# Patient Record
Sex: Female | Born: 1960 | Race: White | Hispanic: No | Marital: Married | Smoking: Never smoker
Health system: Southern US, Community
[De-identification: ages and names within clinical notes are randomized; demographics above are authoritative.]

## PROBLEM LIST (undated history)

## (undated) DIAGNOSIS — N390 Urinary tract infection, site not specified: Secondary | ICD-10-CM

## (undated) DIAGNOSIS — B019 Varicella without complication: Secondary | ICD-10-CM

## (undated) HISTORY — DX: Urinary tract infection, site not specified: N39.0

## (undated) HISTORY — DX: Varicella without complication: B01.9

---
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
## 2003-05-04 ENCOUNTER — Other Ambulatory Visit: Admission: RE | Admit: 2003-05-04 | Discharge: 2003-05-04 | Payer: Self-pay | Admitting: Obstetrics and Gynecology

## 2003-12-22 ENCOUNTER — Ambulatory Visit: Payer: Self-pay | Admitting: Family Medicine

## 2003-12-29 ENCOUNTER — Ambulatory Visit: Payer: Self-pay | Admitting: Internal Medicine

## 2005-05-21 ENCOUNTER — Ambulatory Visit: Payer: Self-pay | Admitting: Family Medicine

## 2005-06-11 ENCOUNTER — Ambulatory Visit: Payer: Self-pay | Admitting: Family Medicine

## 2005-08-04 ENCOUNTER — Ambulatory Visit: Payer: Self-pay | Admitting: Family Medicine

## 2006-07-23 ENCOUNTER — Encounter: Payer: Self-pay | Admitting: Family Medicine

## 2006-07-23 ENCOUNTER — Ambulatory Visit: Payer: Self-pay | Admitting: Family Medicine

## 2006-07-23 LAB — CONVERTED CEMR LAB
ALT: 19 units/L (ref 0–35)
AST: 21 units/L (ref 0–37)
Albumin: 3.4 g/dL — ABNORMAL LOW (ref 3.5–5.2)
Alkaline Phosphatase: 59 units/L (ref 39–117)
Bilirubin, Direct: 0.1 mg/dL (ref 0.0–0.3)
Calcium: 9.6 mg/dL (ref 8.4–10.5)
Chloride: 105 meq/L (ref 96–112)
Eosinophils Absolute: 0.1 10*3/uL (ref 0.0–0.6)
Eosinophils Relative: 0.9 % (ref 0.0–5.0)
GFR calc non Af Amer: 97 mL/min
Glucose, Bld: 87 mg/dL (ref 70–99)
HDL: 109.8 mg/dL (ref >39.0)
MCV: 91.9 fL (ref 78.0–100.0)
Platelets: 290 10*3/uL (ref 150–400)
RBC: 4.15 M/uL (ref 3.87–5.11)
Triglycerides: 80 mg/dL (ref 0–149)
VLDL: 16 mg/dL (ref 0–40)
WBC: 6.3 10*3/uL (ref 4.5–10.5)

## 2006-08-21 ENCOUNTER — Ambulatory Visit: Payer: Self-pay | Admitting: Family Medicine

## 2006-08-21 DIAGNOSIS — G4700 Insomnia, unspecified: Secondary | ICD-10-CM

## 2006-08-21 DIAGNOSIS — L723 Sebaceous cyst: Secondary | ICD-10-CM

## 2006-08-21 DIAGNOSIS — B359 Dermatophytosis, unspecified: Secondary | ICD-10-CM | POA: Insufficient documentation

## 2013-03-03 ENCOUNTER — Ambulatory Visit: Payer: Self-pay | Admitting: Family Medicine

## 2013-03-18 ENCOUNTER — Ambulatory Visit: Payer: Self-pay | Admitting: Family Medicine

## 2013-04-06 ENCOUNTER — Encounter: Payer: Self-pay | Admitting: Family Medicine

## 2013-04-06 ENCOUNTER — Ambulatory Visit (INDEPENDENT_AMBULATORY_CARE_PROVIDER_SITE_OTHER): Payer: BC Managed Care – PPO | Admitting: Family Medicine

## 2013-04-06 VITALS — BP 120/70 | Temp 98.8°F | Ht 69.0 in | Wt 164.0 lb

## 2013-04-06 DIAGNOSIS — M791 Myalgia, unspecified site: Secondary | ICD-10-CM

## 2013-04-06 DIAGNOSIS — Z7689 Persons encountering health services in other specified circumstances: Secondary | ICD-10-CM

## 2013-04-06 DIAGNOSIS — Z7189 Other specified counseling: Secondary | ICD-10-CM

## 2013-04-06 DIAGNOSIS — M79603 Pain in arm, unspecified: Secondary | ICD-10-CM

## 2013-04-06 DIAGNOSIS — IMO0001 Reserved for inherently not codable concepts without codable children: Secondary | ICD-10-CM

## 2013-04-06 DIAGNOSIS — M79609 Pain in unspecified limb: Secondary | ICD-10-CM

## 2013-04-06 NOTE — Progress Notes (Signed)
Chief Complaint  Patient presents with  . Establish Care    HPI:  Taylor Russell is here to establish care.  Last PCP and physical: sees Dr. Josph Macho for yearly physicals. Recently had extensive lab work with thyroid, blood counts and everything for hair loss and all normal.  Has the following chronic problems and concerns today:  Patient Active Problem List   Diagnosis Date Noted  . DERMATOPHYTOSIS, SITE NOS 08/21/2006  . INSOMNIA, PERSISTENT 08/21/2006  . 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  Past Medical History  Diagnosis Date  . UTI (urinary tract infection)   . Chicken pox     Family History  Problem Relation Age of Onset  . Ovarian cancer Maternal Grandmother   . Ovarian cancer Paternal Grandmother   . Prostate cancer Father   . Bone cancer Father   . Lymphoma Father   . Hypertension Mother   . Kidney disease Mother     History   Social History  . Marital Status: Legally Separated    Spouse Name: N/A    Number of Children: N/A  . Years of Education: N/A   Social History Main Topics  . Smoking status: Never Smoker   . Smokeless tobacco: None  . Alcohol Use: Yes     Comment: occ  . Drug Use: None  . Sexual Activity: None   Other Topics Concern  . 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  EXAM:  Filed Vitals:   04/06/13 1111  BP: 120/70  Temp: 98.8 F (37.1 C)    Body mass index is 24.21 kg/(m^2).  GENERAL: vitals reviewed and listed above, alert, oriented, appears well hydrated and in no acute distress  HEENT: atraumatic, conjunttiva clear, no obvious abnormalities on inspection of external nose and ears  NECK: no obvious masses on inspection  LUNGS: clear to auscultation bilaterally, no wheezes, rales or rhonchi, good air movement  CV: HRRR, no peripheral edema  Breasts: normal, axilla normal  MS: moves all extremities without noticeable abnormality -normal sensation and strength in arms -neg tinels, mildly post phalens with pain -TTP bilat pecs, neg chest wall exam otherwise except forward head and shoulder posture  PSYCH: pleasant and cooperative, no obvious depression or anxiety  ASSESSMENT AND PLAN:  Discussed the following assessment and plan:  Encounter to establish care  Arm pain  Muscle pain  -We reviewed the PMH, PSH, FH, SH, Meds and Allergies. -We provided refills for any medications we will prescribe as needed. -We addressed current concerns per orders and patient instructions. -We have  asked for records for pertinent exams, studies, vaccines and notes from previous providers. -We have advised patient to follow up per instructions below. -arms symptoms sound like CTS and advised cockup brace, proper posture  and HEP after discussion possible etioloigies -advised to get mammogram, no abnormalities of breasts -TTP in pectoralis muscles with forward head and shoulder posture and large breasts and advised exercises and stretching and good bra for this, offered CXR and labs - she declined at this time -follow up in 1-2 months  -Patient advised to  return or notify a doctor immediately if symptoms worsen or persist or new concerns arise.  Patient Instructions  -please schedule mammogram  -PLEASE SIGN UP FOR MYCHART TODAY   We recommend the following healthy lifestyle measures: - eat a healthy diet consisting of lots of vegetables, fruits, beans, nuts, seeds, healthy meats such as white chicken and fish and whole grains.  - avoid fried foods, fast food, processed foods, sodas, red meet and other fattening foods.  - get a least 150 minutes of aerobic exercise per week.   Follow up in:       Kriste BasqueKIM, HANNAH R.

## 2013-04-06 NOTE — Patient Instructions (Addendum)
-  please schedule mammogram  -PLEASE SIGN UP FOR MYCHART TODAY   -where cock up brace at night  -home exercises  We recommend the following healthy lifestyle measures: - eat a healthy diet consisting of lots of vegetables, fruits, beans, nuts, seeds, healthy meats such as white chicken and fish and whole grains.  - avoid fried foods, fast food, processed foods, sodas, red meet and other fattening foods.  - get a least 150 minutes of aerobic exercise per week.   Follow up in:  1-2 months

## 2013-05-09 ENCOUNTER — Encounter: Payer: Self-pay | Admitting: Family Medicine

## 2013-05-19 ENCOUNTER — Ambulatory Visit: Payer: BC Managed Care – PPO | Admitting: Family Medicine

## 2015-04-26 DIAGNOSIS — R1084 Generalized abdominal pain: Secondary | ICD-10-CM | POA: Diagnosis not present

## 2015-04-26 DIAGNOSIS — R140 Abdominal distension (gaseous): Secondary | ICD-10-CM | POA: Diagnosis not present

## 2015-04-26 DIAGNOSIS — R102 Pelvic and perineal pain: Secondary | ICD-10-CM | POA: Diagnosis not present

## 2015-04-26 DIAGNOSIS — K5900 Constipation, unspecified: Secondary | ICD-10-CM | POA: Diagnosis not present

## 2015-07-12 DIAGNOSIS — M79669 Pain in unspecified lower leg: Secondary | ICD-10-CM | POA: Diagnosis not present

## 2015-10-19 DIAGNOSIS — L639 Alopecia areata, unspecified: Secondary | ICD-10-CM | POA: Diagnosis not present

## 2015-10-19 DIAGNOSIS — D2262 Melanocytic nevi of left upper limb, including shoulder: Secondary | ICD-10-CM | POA: Diagnosis not present

## 2015-10-19 DIAGNOSIS — D2271 Melanocytic nevi of right lower limb, including hip: Secondary | ICD-10-CM | POA: Diagnosis not present

## 2015-10-19 DIAGNOSIS — D225 Melanocytic nevi of trunk: Secondary | ICD-10-CM | POA: Diagnosis not present

## 2015-11-02 DIAGNOSIS — M79669 Pain in unspecified lower leg: Secondary | ICD-10-CM | POA: Diagnosis not present

## 2016-06-16 DIAGNOSIS — Z01419 Encounter for gynecological examination (general) (routine) without abnormal findings: Secondary | ICD-10-CM | POA: Diagnosis not present

## 2016-06-16 DIAGNOSIS — Z131 Encounter for screening for diabetes mellitus: Secondary | ICD-10-CM | POA: Diagnosis not present

## 2016-06-16 DIAGNOSIS — Z1231 Encounter for screening mammogram for malignant neoplasm of breast: Secondary | ICD-10-CM | POA: Diagnosis not present

## 2016-06-16 DIAGNOSIS — Z1329 Encounter for screening for other suspected endocrine disorder: Secondary | ICD-10-CM | POA: Diagnosis not present

## 2016-06-16 DIAGNOSIS — Z0000 Encounter for general adult medical examination without abnormal findings: Secondary | ICD-10-CM | POA: Diagnosis not present

## 2016-06-16 DIAGNOSIS — Z130 Encounter for screening for diseases of the blood and blood-forming organs and certain disorders involving the immune mechanism: Secondary | ICD-10-CM | POA: Diagnosis not present

## 2016-06-16 DIAGNOSIS — Z6825 Body mass index (BMI) 25.0-25.9, adult: Secondary | ICD-10-CM | POA: Diagnosis not present

## 2016-06-16 DIAGNOSIS — Z1321 Encounter for screening for nutritional disorder: Secondary | ICD-10-CM | POA: Diagnosis not present

## 2016-06-16 DIAGNOSIS — Z13220 Encounter for screening for lipoid disorders: Secondary | ICD-10-CM | POA: Diagnosis not present

## 2016-10-23 DIAGNOSIS — D225 Melanocytic nevi of trunk: Secondary | ICD-10-CM | POA: Diagnosis not present

## 2016-10-23 DIAGNOSIS — D2271 Melanocytic nevi of right lower limb, including hip: Secondary | ICD-10-CM | POA: Diagnosis not present

## 2016-10-23 DIAGNOSIS — L818 Other specified disorders of pigmentation: Secondary | ICD-10-CM | POA: Diagnosis not present

## 2016-10-23 DIAGNOSIS — D1800 Hemangioma unspecified site: Secondary | ICD-10-CM | POA: Diagnosis not present

## 2016-12-25 DIAGNOSIS — B029 Zoster without complications: Secondary | ICD-10-CM | POA: Diagnosis not present

## 2016-12-26 DIAGNOSIS — D3132 Benign neoplasm of left choroid: Secondary | ICD-10-CM | POA: Diagnosis not present

## 2016-12-26 DIAGNOSIS — H04123 Dry eye syndrome of bilateral lacrimal glands: Secondary | ICD-10-CM | POA: Diagnosis not present

## 2017-01-28 DIAGNOSIS — D3132 Benign neoplasm of left choroid: Secondary | ICD-10-CM | POA: Diagnosis not present

## 2017-01-28 DIAGNOSIS — H04123 Dry eye syndrome of bilateral lacrimal glands: Secondary | ICD-10-CM | POA: Diagnosis not present

## 2017-07-29 DIAGNOSIS — B0230 Zoster ocular disease, unspecified: Secondary | ICD-10-CM | POA: Diagnosis not present

## 2017-07-29 DIAGNOSIS — H04123 Dry eye syndrome of bilateral lacrimal glands: Secondary | ICD-10-CM | POA: Diagnosis not present

## 2017-07-29 DIAGNOSIS — D3132 Benign neoplasm of left choroid: Secondary | ICD-10-CM | POA: Diagnosis not present

## 2017-08-07 DIAGNOSIS — N952 Postmenopausal atrophic vaginitis: Secondary | ICD-10-CM | POA: Diagnosis not present

## 2017-08-07 DIAGNOSIS — Z1329 Encounter for screening for other suspected endocrine disorder: Secondary | ICD-10-CM | POA: Diagnosis not present

## 2017-08-07 DIAGNOSIS — Z130 Encounter for screening for diseases of the blood and blood-forming organs and certain disorders involving the immune mechanism: Secondary | ICD-10-CM | POA: Diagnosis not present

## 2017-08-07 DIAGNOSIS — Z0000 Encounter for general adult medical examination without abnormal findings: Secondary | ICD-10-CM | POA: Diagnosis not present

## 2017-08-07 DIAGNOSIS — Z13220 Encounter for screening for lipoid disorders: Secondary | ICD-10-CM | POA: Diagnosis not present

## 2017-08-07 DIAGNOSIS — Z01419 Encounter for gynecological examination (general) (routine) without abnormal findings: Secondary | ICD-10-CM | POA: Diagnosis not present

## 2017-08-07 DIAGNOSIS — Z6826 Body mass index (BMI) 26.0-26.9, adult: Secondary | ICD-10-CM | POA: Diagnosis not present

## 2017-08-07 DIAGNOSIS — Z131 Encounter for screening for diabetes mellitus: Secondary | ICD-10-CM | POA: Diagnosis not present

## 2017-11-19 DIAGNOSIS — D225 Melanocytic nevi of trunk: Secondary | ICD-10-CM | POA: Diagnosis not present

## 2017-11-19 DIAGNOSIS — B351 Tinea unguium: Secondary | ICD-10-CM | POA: Diagnosis not present

## 2017-11-19 DIAGNOSIS — D2261 Melanocytic nevi of right upper limb, including shoulder: Secondary | ICD-10-CM | POA: Diagnosis not present

## 2017-11-19 DIAGNOSIS — D2271 Melanocytic nevi of right lower limb, including hip: Secondary | ICD-10-CM | POA: Diagnosis not present

## 2017-11-19 DIAGNOSIS — D2262 Melanocytic nevi of left upper limb, including shoulder: Secondary | ICD-10-CM | POA: Diagnosis not present

## 2017-11-24 DIAGNOSIS — R635 Abnormal weight gain: Secondary | ICD-10-CM | POA: Diagnosis not present

## 2017-11-24 DIAGNOSIS — Z1211 Encounter for screening for malignant neoplasm of colon: Secondary | ICD-10-CM | POA: Diagnosis not present

## 2017-11-24 DIAGNOSIS — R194 Change in bowel habit: Secondary | ICD-10-CM | POA: Diagnosis not present

## 2017-11-25 ENCOUNTER — Other Ambulatory Visit: Payer: Self-pay | Admitting: Internal Medicine

## 2017-11-25 DIAGNOSIS — E041 Nontoxic single thyroid nodule: Secondary | ICD-10-CM

## 2017-11-25 DIAGNOSIS — Z8349 Family history of other endocrine, nutritional and metabolic diseases: Secondary | ICD-10-CM | POA: Diagnosis not present

## 2017-11-27 ENCOUNTER — Ambulatory Visit
Admission: RE | Admit: 2017-11-27 | Discharge: 2017-11-27 | Disposition: A | Discharge status: Home / Self Care | Payer: BLUE CROSS/BLUE SHIELD | Source: Ambulatory Visit | Attending: Internal Medicine | Admitting: Internal Medicine

## 2017-11-27 DIAGNOSIS — E041 Nontoxic single thyroid nodule: Secondary | ICD-10-CM | POA: Diagnosis not present

## 2018-06-03 DIAGNOSIS — Z0000 Encounter for general adult medical examination without abnormal findings: Secondary | ICD-10-CM | POA: Diagnosis not present

## 2018-06-03 DIAGNOSIS — E559 Vitamin D deficiency, unspecified: Secondary | ICD-10-CM | POA: Diagnosis not present

## 2018-06-03 DIAGNOSIS — Z0001 Encounter for general adult medical examination with abnormal findings: Secondary | ICD-10-CM | POA: Diagnosis not present

## 2018-06-17 DIAGNOSIS — Z0000 Encounter for general adult medical examination without abnormal findings: Secondary | ICD-10-CM | POA: Diagnosis not present

## 2018-06-17 DIAGNOSIS — E559 Vitamin D deficiency, unspecified: Secondary | ICD-10-CM | POA: Diagnosis not present

## 2018-07-28 DIAGNOSIS — D3132 Benign neoplasm of left choroid: Secondary | ICD-10-CM | POA: Diagnosis not present

## 2018-07-28 DIAGNOSIS — H16223 Keratoconjunctivitis sicca, not specified as Sjogren's, bilateral: Secondary | ICD-10-CM | POA: Diagnosis not present

## 2018-09-07 DIAGNOSIS — Z20828 Contact with and (suspected) exposure to other viral communicable diseases: Secondary | ICD-10-CM | POA: Diagnosis not present

## 2018-09-07 DIAGNOSIS — Z6825 Body mass index (BMI) 25.0-25.9, adult: Secondary | ICD-10-CM | POA: Diagnosis not present

## 2018-09-08 DIAGNOSIS — Z01419 Encounter for gynecological examination (general) (routine) without abnormal findings: Secondary | ICD-10-CM | POA: Diagnosis not present

## 2018-09-08 DIAGNOSIS — Z6826 Body mass index (BMI) 26.0-26.9, adult: Secondary | ICD-10-CM | POA: Diagnosis not present

## 2018-09-08 DIAGNOSIS — Z1231 Encounter for screening mammogram for malignant neoplasm of breast: Secondary | ICD-10-CM | POA: Diagnosis not present

## 2018-09-08 DIAGNOSIS — Z1151 Encounter for screening for human papillomavirus (HPV): Secondary | ICD-10-CM | POA: Diagnosis not present

## 2018-10-28 DIAGNOSIS — D225 Melanocytic nevi of trunk: Secondary | ICD-10-CM | POA: Diagnosis not present

## 2018-10-28 DIAGNOSIS — D2271 Melanocytic nevi of right lower limb, including hip: Secondary | ICD-10-CM | POA: Diagnosis not present

## 2018-10-28 DIAGNOSIS — Z23 Encounter for immunization: Secondary | ICD-10-CM | POA: Diagnosis not present

## 2018-10-28 DIAGNOSIS — D2261 Melanocytic nevi of right upper limb, including shoulder: Secondary | ICD-10-CM | POA: Diagnosis not present

## 2018-10-28 DIAGNOSIS — D2262 Melanocytic nevi of left upper limb, including shoulder: Secondary | ICD-10-CM | POA: Diagnosis not present

## 2019-03-12 ENCOUNTER — Ambulatory Visit: Payer: BC Managed Care – PPO | Attending: Internal Medicine

## 2019-03-12 ENCOUNTER — Other Ambulatory Visit: Payer: Self-pay

## 2019-03-12 DIAGNOSIS — Z23 Encounter for immunization: Secondary | ICD-10-CM | POA: Insufficient documentation

## 2019-03-12 NOTE — Progress Notes (Signed)
   Covid-19 Vaccination Clinic  Name:  Taylor Russell    MRN: [id number] DOB: 1960/07/12  03/12/2019  [name] was observed post Covid-19 immunization for 15 minutes without incident. She was provided with Vaccine Information Sheet and instruction to access the V-Safe system.   [name] was instructed to call 911 with any severe reactions post vaccine: Marland Kitchen Difficulty breathing  . Swelling of face and throat  . A fast heartbeat  . A bad rash all over body  . Dizziness and weakness   Immunizations Administered    Name Date Dose VIS Date Route   Moderna COVID-19 Vaccine 03/12/2019  9:29 AM 0.5 mL 12/07/2018 Intramuscular   Manufacturer: Moderna   Lot: [id number]   NDC: 33007-622-63

## 2019-03-26 ENCOUNTER — Emergency Department (HOSPITAL_BASED_OUTPATIENT_CLINIC_OR_DEPARTMENT_OTHER)
Admission: EM | Admit: 2019-03-26 | Discharge: 2019-03-26 | Disposition: A | Discharge status: Home / Self Care | Payer: BC Managed Care – PPO | Attending: Emergency Medicine | Admitting: Emergency Medicine

## 2019-03-26 ENCOUNTER — Encounter (HOSPITAL_BASED_OUTPATIENT_CLINIC_OR_DEPARTMENT_OTHER): Payer: Self-pay | Admitting: Emergency Medicine

## 2019-03-26 DIAGNOSIS — Y999 Unspecified external cause status: Secondary | ICD-10-CM | POA: Diagnosis not present

## 2019-03-26 DIAGNOSIS — W268XXA Contact with other sharp object(s), not elsewhere classified, initial encounter: Secondary | ICD-10-CM | POA: Insufficient documentation

## 2019-03-26 DIAGNOSIS — S61217A Laceration without foreign body of left little finger without damage to nail, initial encounter: Secondary | ICD-10-CM | POA: Insufficient documentation

## 2019-03-26 DIAGNOSIS — Z23 Encounter for immunization: Secondary | ICD-10-CM | POA: Insufficient documentation

## 2019-03-26 DIAGNOSIS — Y92000 Kitchen of unspecified non-institutional (private) residence as  the place of occurrence of the external cause: Secondary | ICD-10-CM | POA: Insufficient documentation

## 2019-03-26 DIAGNOSIS — Y93E9 Activity, other interior property and clothing maintenance: Secondary | ICD-10-CM | POA: Insufficient documentation

## 2019-03-26 MED ORDER — TETANUS-DIPHTH-ACELL PERTUSSIS 5-2.5-18.5 LF-MCG/0.5 IM SUSP
0.5000 mL | Freq: Once | INTRAMUSCULAR | Status: DC
  Filled 2019-03-26: qty 0.5

## 2019-03-26 MED ORDER — LIDOCAINE HCL 2 % IJ SOLN
10.0000 mL | Freq: Once | INTRAMUSCULAR | Status: AC
  Administered 2019-03-26: 11:00:00 200 mg via INTRADERMAL
  Filled 2019-03-26: qty 20

## 2019-03-26 MED ORDER — TETANUS-DIPHTH-ACELL PERTUSSIS 5-2.5-18.5 LF-MCG/0.5 IM SUSP
0.5000 mL | Freq: Once | INTRAMUSCULAR | Status: AC
  Administered 2019-03-26: 0.5 mL via INTRAMUSCULAR

## 2019-03-26 NOTE — ED Notes (Signed)
ED Provider at bedside. 

## 2019-03-26 NOTE — Discharge Instructions (Signed)
Please follow-up for suture removal at either urgent care ,the emergency department, or your primary care doctor in 10-14 days.  Please return to the emergency room immediately if you experience any new or worsening symptoms or any symptoms that indicate worsening infection such as fevers, increased redness/swelling/pain, warmth, or drainage from the affected area.   

## 2019-03-26 NOTE — ED Triage Notes (Signed)
States cut her finger while cleaning the oven about 20 min ago. Lac to 5th finger left hand, pressure drsg applied

## 2019-03-26 NOTE — ED Provider Notes (Signed)
[facility] EMERGENCY DEPARTMENT Provider Note   CSN: [id number] Arrival date & time: 03/26/19  1010     History Chief Complaint  Patient presents with  . Laceration    Taylor Russell is a 59 y.o. female.  HPI   Pt is a 59 y/o female who presents to the ED today for eval of left 5th finger injury. States she cut it while cleaning her oven PTA.  She has normal range of motion and sensation. Denies other injuries.  Last Tdap was in 2015.   Past Medical History:  Diagnosis Date  . Chicken pox   . UTI (urinary tract infection)     Patient Active Problem List   Diagnosis Date Noted  . DERMATOPHYTOSIS, SITE NOS 08/21/2006  . INSOMNIA, PERSISTENT 08/21/2006  . EPIDERMOID CYST 08/21/2006    Past Surgical History:  Procedure Laterality Date  . APPENDECTOMY  1968  . TONSILLECTOMY AND ADENOIDECTOMY  1975     OB History   No obstetric history on file.     Family History  Problem Relation Age of Onset  . Ovarian cancer Maternal Grandmother   . Ovarian cancer Paternal Grandmother   . Prostate cancer Father   . Bone cancer Father   . Lymphoma Father   . Hypertension Mother   . Kidney disease Mother     Social History   Tobacco Use  . Smoking status: Never Smoker  . Smokeless tobacco: Never Used  Substance Use Topics  . Alcohol use: Yes    Comment: occ  . Drug use: Not on file    Home Medications Prior to Admission medications   Not on File    Allergies    Patient has no known allergies.  Review of Systems   Review of Systems  Musculoskeletal:       Left 5th finger pain  Skin: Positive for wound.  Neurological: Negative for weakness and numbness.    Physical Exam Updated Vital Signs BP (!) 168/102 (BP Location: Right Arm)   Pulse 75   Temp 98.3 F (36.8 C) (Oral)   Resp 18   SpO2 99%   Physical Exam Vitals and nursing note reviewed.  Constitutional:      General: She is not in acute distress.    Appearance: She is  well-developed.  HENT:     Head: Normocephalic and atraumatic.  Eyes:     Conjunctiva/sclera: Conjunctivae normal.  Cardiovascular:     Rate and Rhythm: Normal rate.  Pulmonary:     Effort: Pulmonary effort is normal.  Musculoskeletal:        General: Normal range of motion.     Cervical back: Neck supple.  Skin:    General: Skin is warm and dry.     Comments: vshpaed laceration to the dorsum of the left 5th digit. Superficial wound. One side of v is 2cm and other side is 1cm.  Neurological:     Mental Status: She is alert.     ED Results / Procedures / Treatments   Labs (all labs ordered are listed, but only abnormal results are displayed) Labs Reviewed - No data to display  EKG None  Radiology No results found.  Procedures .Marland KitchenLaceration Repair  Date/Time: 03/26/2019 11:44 AM Performed by: Karrie Meres, PA-C Authorized by: Karrie Meres, PA-C   Consent:    Consent obtained:  Verbal   Consent given by:  Patient   Risks discussed:  Infection, pain, poor cosmetic result and need for additional  repair   Alternatives discussed:  No treatment Anesthesia (see MAR for exact dosages):    Anesthesia method:  Nerve block   Block needle gauge:  25 G   Block anesthetic:  Lidocaine 2% w/o epi   Block injection procedure:  Anatomic landmarks identified, introduced needle, incremental injection, negative aspiration for blood and anatomic landmarks palpated   Block outcome:  Anesthesia achieved Laceration details:    Location:  Finger   Finger location:  L small finger   Length (cm):  3   Laceration depth: superficial. Repair type:    Repair type:  Simple Pre-procedure details:    Preparation:  Patient was prepped and draped in usual sterile fashion Exploration:    Hemostasis achieved with:  Direct pressure   Wound exploration: wound explored through full range of motion and entire depth of wound probed and visualized     Wound extent: no nerve damage noted, no  tendon damage noted and no underlying fracture noted     Contaminated: no   Treatment:    Area cleansed with:  Saline   Amount of cleaning:  Standard   Irrigation solution:  Sterile saline   Irrigation volume:  500cc   Irrigation method:  Pressure wash   Visualized foreign bodies/material removed: no   Skin repair:    Repair method:  Sutures   Suture size:  5-0   Suture material:  Prolene   Suture technique:  Simple interrupted   Number of sutures:  6 Approximation:    Approximation:  Close Post-procedure details:    Dressing:  Sterile dressing and splint for protection   Patient tolerance of procedure:  Tolerated well, no immediate complications   (including critical care time)  Medications Ordered in ED Medications  lidocaine (XYLOCAINE) 2 % (with pres) injection 200 mg (200 mg Intradermal Given 03/26/19 1054)  Tdap (BOOSTRIX) injection 0.5 mL (0.5 mLs Intramuscular Given 03/26/19 1052)    ED Course  I have reviewed the triage vital signs and the nursing notes.  Pertinent labs & imaging results that were available during my care of the patient were reviewed by me and considered in my medical decision making (see chart for details).    MDM Rules/Calculators/A&P                      Pressure irrigation performed. Wound explored and base of wound visualized in a bloodless field without evidence of foreign body.  Laceration occurred < 8 hours prior to repair which was well tolerated.  Tdap updated.  Pt has  no comorbidities to effect normal wound healing. Pt discharged  without antibiotics.  Discussed suture home care with patient and answered questions. Pt to follow-up for wound check and suture removal in 10-14 days; they are to return to the ED sooner for signs of infection. Pt is hemodynamically stable with no complaints prior to dc.    Final Clinical Impression(s) / ED Diagnoses Final diagnoses:  None    Rx / DC Orders ED Discharge Orders    None       Rodney Booze, PA-C 03/26/19 1145    Lucrezia Starch, MD 03/27/19 [phone]

## 2019-04-13 ENCOUNTER — Ambulatory Visit: Payer: BC Managed Care – PPO | Attending: Internal Medicine

## 2019-04-13 DIAGNOSIS — Z23 Encounter for immunization: Secondary | ICD-10-CM

## 2019-04-13 NOTE — Progress Notes (Signed)
   Covid-19 Vaccination Clinic  Name:  Taylor Russell    MRN: [id number] DOB: 17-Jul-1960  04/13/2019  Taylor Russell was observed post Covid-19 immunization for 15 minutes without incident. She was provided with Vaccine Information Sheet and instruction to access the V-Safe system.   Taylor Russell was instructed to call 911 with any severe reactions post vaccine: Marland Kitchen Difficulty breathing  . Swelling of face and throat  . A fast heartbeat  . A bad rash all over body  . Dizziness and weakness   Immunizations Administered    Name Date Dose VIS Date Route   Moderna COVID-19 Vaccine 04/13/2019  9:57 AM 0.5 mL 12/07/2018 Intramuscular   Manufacturer: Gala Murdoch   Lot: [id number]   NDC: 71278-718-36

## 2019-09-06 IMAGING — US US THYROID
1 series · 13 of 25 positions shown · non-contrast
Comparison: None.

CLINICAL DATA: Incidental on US. Thyroid nodule incidentally noted
on lifetime screening examination performed at work.

EXAM:
THYROID ULTRASOUND
TECHNIQUE: Ultrasound examination of the thyroid gland and adjacent soft
tissues was performed.

[Series 1: us thyroid · 0.04mm/px · 13 of 51 slices shown]
[im 1/51]
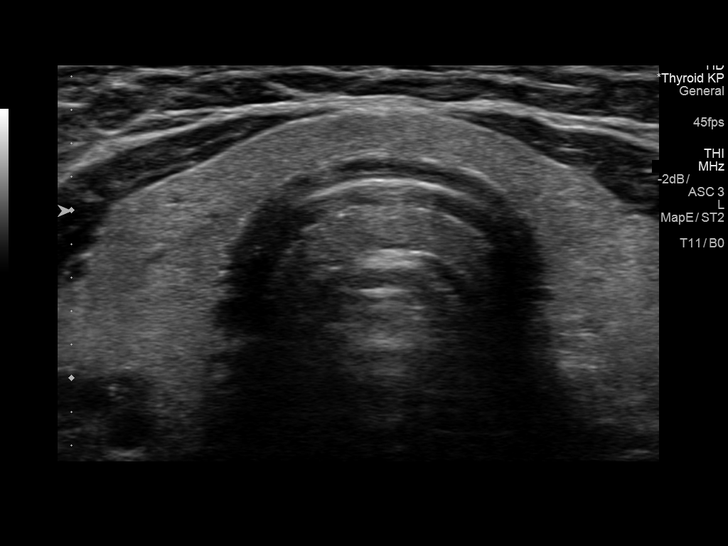
[im 5/51]
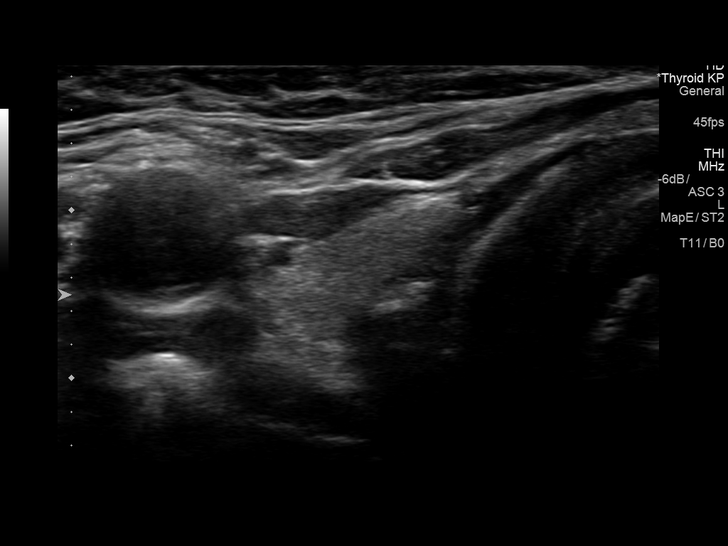
[im 9/51]
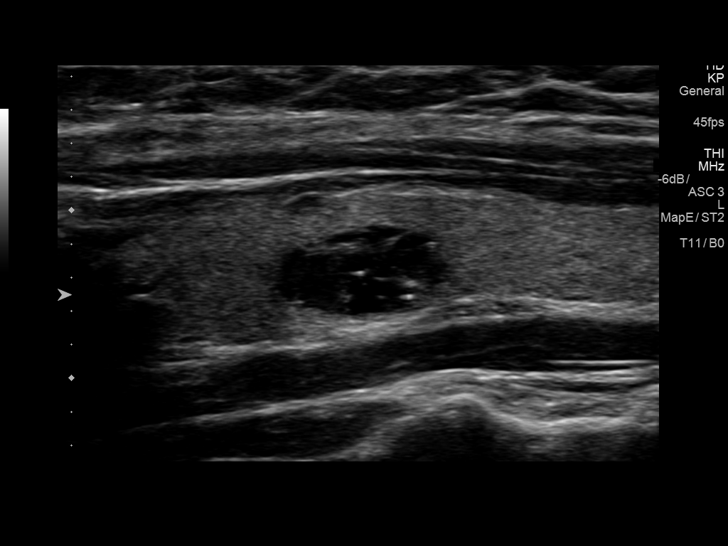
[im 13/51]
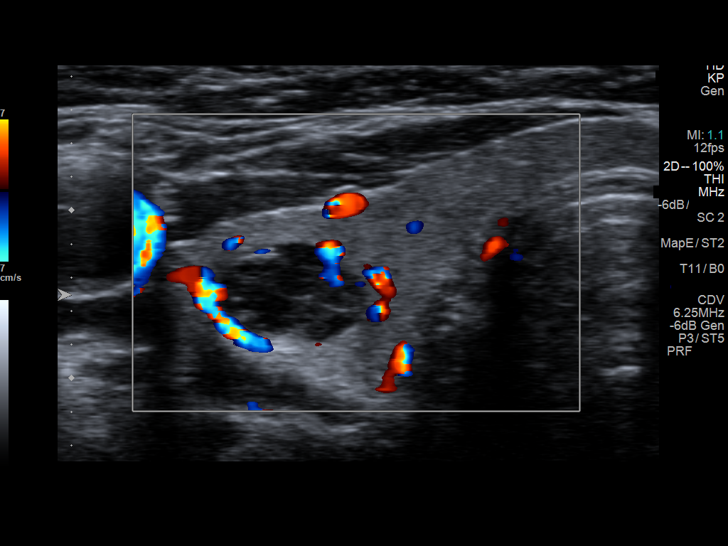
[im 17/51]
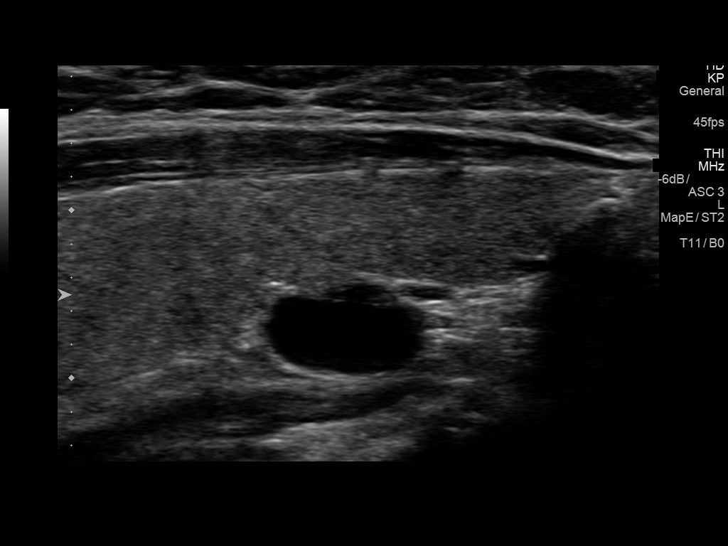
[im 21/51]
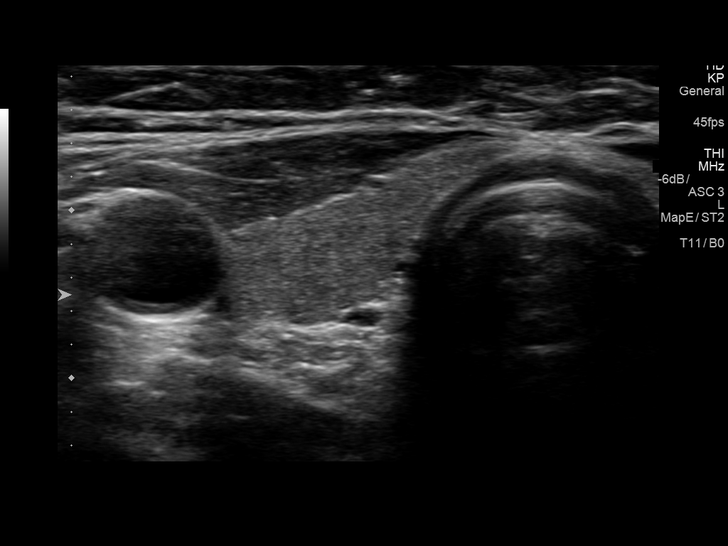
[im 26/51]
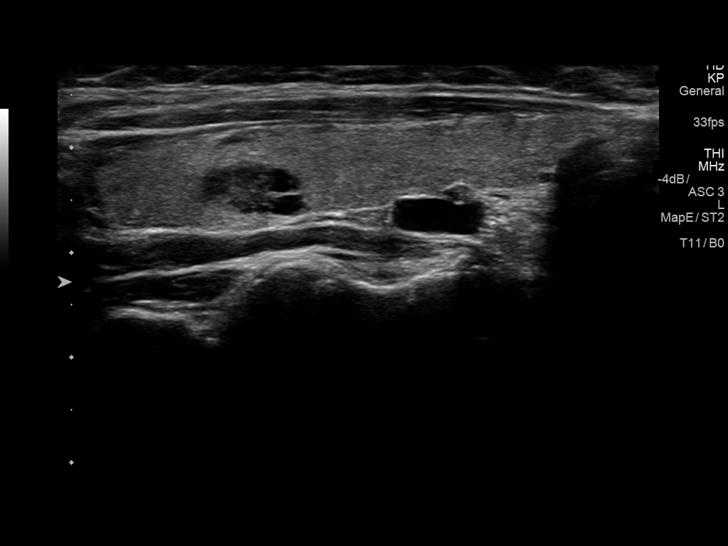
[im 30/51]
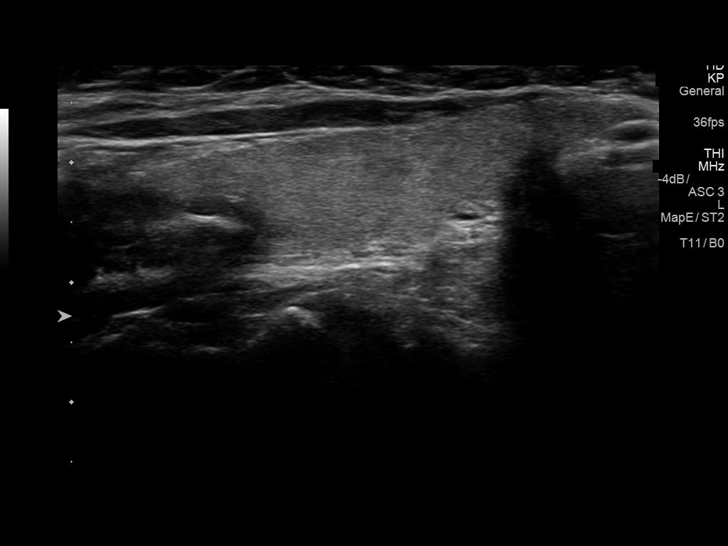
[im 34/51]
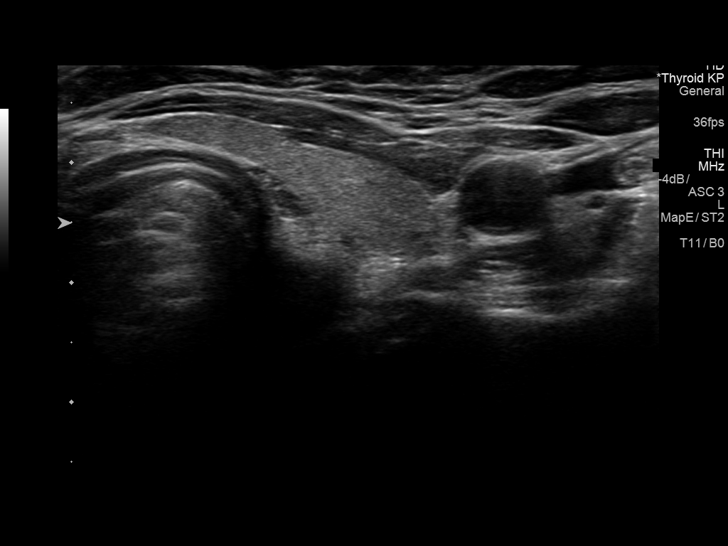
[im 38/51]
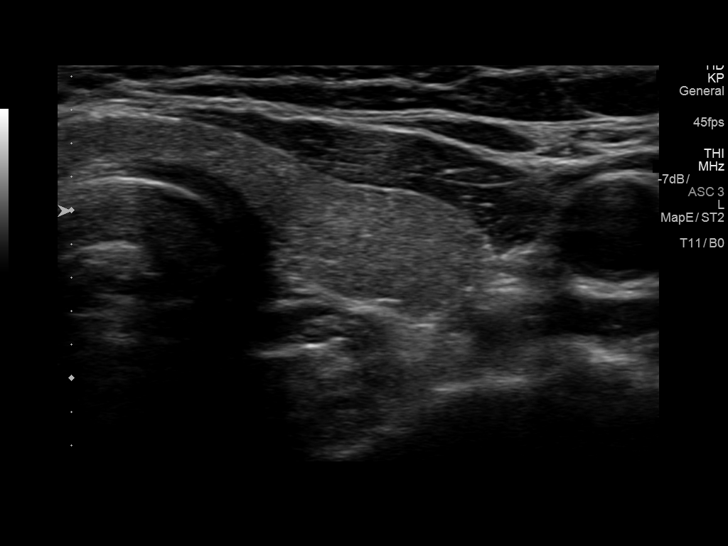
[im 42/51]
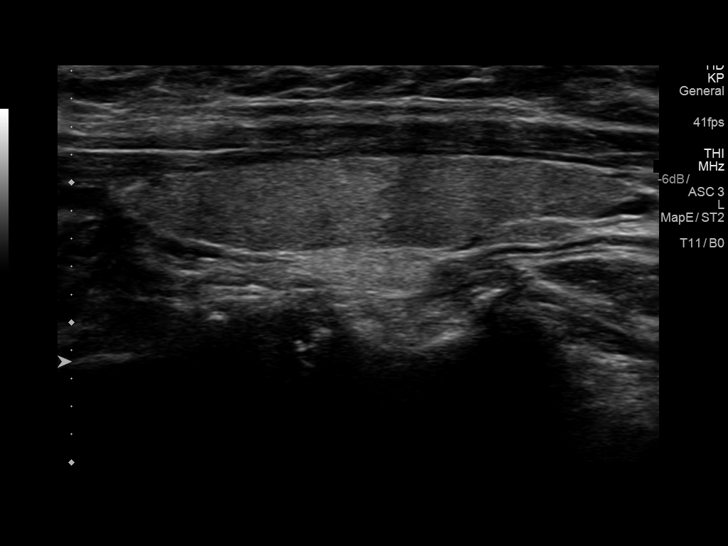
[im 46/51]
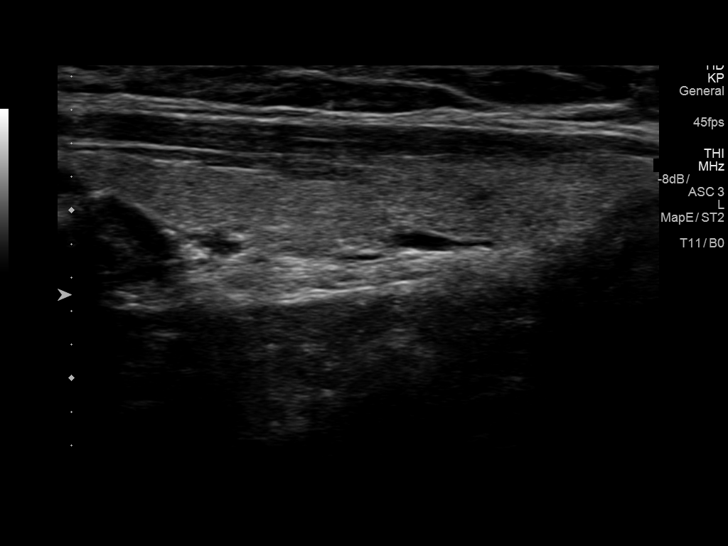
[im 51/51]
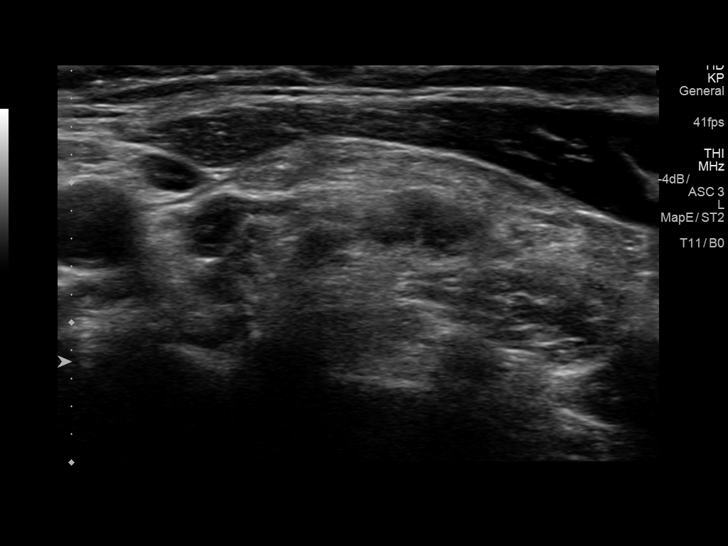

[13 of 25 positions shown; findings below may reference images not displayed]

FINDINGS: Parenchymal Echotexture: Mildly heterogenous

Isthmus: Normal in size measuring 0.3 cm in diameter

Right lobe: Normal in size measuring 5.1 x 1.1 x 1.8 cm

Left lobe: Slightly diminutive in size measuring 3.8 x 1.3 x 0.7 cm

_________________________________________________________

Estimated total number of nodules >/= 1 cm: 2

Number of spongiform nodules >/=  2 cm not described below (TR1): 0

Number of mixed cystic and solid nodules >/= 1.5 cm not described
below (TR2): 0

_________________________________________________________

There is an approximately 1.0 x 1.0 x 0.6 cm spongiform/benign
appearing nodule within mid aspect of the right lobe of the thyroid
(labeled 1) which contains several internal echogenic foci with ring
down artifact compatible with benign colloid. This benign appearing
nodule does not meet imaging criteria to recommend percutaneous
sampling or continued dedicated follow-up.

There is an approximately 1.0 x 0.9 x 0.6 cm anechoic cyst arising
from the mid, posterior aspect of the right lobe of the thyroid
which does not meet imaging criteria to recommend percutaneous
sampling or continued dedicated follow-up.
IMPRESSION: Slightly atrophic and mildly heterogeneous appearing thyroid gland
without worrisome thyroid nodule or mass.

The above is in keeping with the ACR TI-RADS recommendations - [HOSPITAL] 9044;[DATE].

## 2023-10-13 ENCOUNTER — Other Ambulatory Visit: Payer: Self-pay | Admitting: Medical Genetics

## 2023-11-24 ENCOUNTER — Other Ambulatory Visit

## 2023-11-24 DIAGNOSIS — Z006 Encounter for examination for normal comparison and control in clinical research program: Secondary | ICD-10-CM

## 2023-12-07 LAB — GENECONNECT MOLECULAR SCREEN: Genetic Analysis Overall Interpretation: NEGATIVE
